# Patient Record
Sex: Male | Born: 1980 | Race: White | Hispanic: No | Marital: Married
Health system: Southern US, Community
[De-identification: ages and names within clinical notes are randomized; demographics above are authoritative.]

---

## 2010-02-20 ENCOUNTER — Emergency Department: Payer: Self-pay | Admitting: Emergency Medicine

## 2010-09-22 ENCOUNTER — Inpatient Hospital Stay: Payer: Self-pay | Admitting: Internal Medicine

## 2011-07-07 ENCOUNTER — Emergency Department: Payer: Self-pay | Admitting: Emergency Medicine

## 2011-07-11 ENCOUNTER — Emergency Department: Payer: Self-pay | Admitting: *Deleted

## 2012-04-01 ENCOUNTER — Emergency Department: Payer: Self-pay | Admitting: Emergency Medicine

## 2012-04-01 LAB — DRUG SCREEN, URINE
Amphetamines, Ur Screen: NEGATIVE (ref ?–1000)
Cannabinoid 50 Ng, Ur ~~LOC~~: NEGATIVE (ref ?–50)
Cocaine Metabolite,Ur ~~LOC~~: NEGATIVE (ref ?–300)
MDMA (Ecstasy)Ur Screen: POSITIVE (ref ?–500)
Opiate, Ur Screen: NEGATIVE (ref ?–300)
Phencyclidine (PCP) Ur S: NEGATIVE (ref ?–25)

## 2012-04-01 LAB — COMPREHENSIVE METABOLIC PANEL
BUN: 7 mg/dL (ref 7–18)
Bilirubin,Total: 0.4 mg/dL (ref 0.2–1.0)
Calcium, Total: 8.9 mg/dL (ref 8.5–10.1)
Chloride: 103 mmol/L (ref 98–107)
EGFR (African American): >60
Glucose: 145 mg/dL — ABNORMAL HIGH (ref 65–99)
Osmolality: 274 (ref 275–301)
Potassium: 4.1 mmol/L (ref 3.5–5.1)
SGOT(AST): 23 U/L (ref 15–37)
SGPT (ALT): 50 U/L
Sodium: 137 mmol/L (ref 136–145)

## 2012-04-01 LAB — URINALYSIS, COMPLETE
Bacteria: NONE SEEN
Blood: NEGATIVE
Glucose,UR: NEGATIVE mg/dL (ref 0–75)
Leukocyte Esterase: NEGATIVE
Nitrite: NEGATIVE
Protein: NEGATIVE
RBC,UR: 1 /HPF (ref 0–5)

## 2012-04-01 LAB — CBC
HCT: 44.8 % (ref 40.0–52.0)
MCH: 34.1 pg — ABNORMAL HIGH (ref 26.0–34.0)
MCHC: 34.7 g/dL (ref 32.0–36.0)
MCV: 99 fL (ref 80–100)
Platelet: 246 10*3/uL (ref 150–440)
RBC: 4.54 10*6/uL (ref 4.40–5.90)
RDW: 12.7 % (ref 11.5–14.5)
WBC: 6.3 10*3/uL (ref 3.8–10.6)

## 2012-06-20 ENCOUNTER — Emergency Department: Payer: Self-pay | Admitting: *Deleted

## 2012-07-02 ENCOUNTER — Emergency Department: Payer: Self-pay | Admitting: Emergency Medicine

## 2012-07-23 ENCOUNTER — Observation Stay: Payer: Self-pay | Admitting: Specialist

## 2012-07-23 LAB — DRUG SCREEN, URINE
Benzodiazepine, Ur Scrn: POSITIVE (ref ?–200)
Cannabinoid 50 Ng, Ur ~~LOC~~: NEGATIVE (ref ?–50)
MDMA (Ecstasy)Ur Screen: POSITIVE (ref ?–500)
Tricyclic, Ur Screen: NEGATIVE (ref ?–1000)

## 2012-07-23 LAB — BASIC METABOLIC PANEL
Anion Gap: 4 — ABNORMAL LOW (ref 7–16)
BUN: 8 mg/dL (ref 7–18)
Calcium, Total: 8.6 mg/dL (ref 8.5–10.1)
Co2: 31 mmol/L (ref 21–32)
EGFR (African American): >60
EGFR (Non-African Amer.): >60
Glucose: 101 mg/dL — ABNORMAL HIGH (ref 65–99)
Osmolality: 280 (ref 275–301)
Potassium: 3.7 mmol/L (ref 3.5–5.1)
Sodium: 141 mmol/L (ref 136–145)

## 2012-07-23 LAB — CBC WITH DIFFERENTIAL/PLATELET
Basophil %: 1.2 %
Eosinophil %: 5.3 %
HCT: 43.4 % (ref 40.0–52.0)
Lymphocyte #: 2.6 10*3/uL (ref 1.0–3.6)
Lymphocyte %: 42.6 %
Monocyte #: 0.4 x10 3/mm (ref 0.2–1.0)
Monocyte %: 6.8 %
Neutrophil %: 44.1 %
Platelet: 242 10*3/uL (ref 150–440)
RBC: 4.47 10*6/uL (ref 4.40–5.90)
RDW: 13.2 % (ref 11.5–14.5)
WBC: 6.1 10*3/uL (ref 3.8–10.6)

## 2012-10-16 ENCOUNTER — Emergency Department: Payer: Self-pay | Admitting: Emergency Medicine

## 2012-10-16 LAB — BASIC METABOLIC PANEL
Anion Gap: 8 (ref 7–16)
BUN: 9 mg/dL (ref 7–18)
Calcium, Total: 8.9 mg/dL (ref 8.5–10.1)
Chloride: 106 mmol/L (ref 98–107)
Potassium: 4.3 mmol/L (ref 3.5–5.1)
Sodium: 139 mmol/L (ref 136–145)

## 2012-10-16 LAB — CBC
MCH: 34.4 pg — ABNORMAL HIGH (ref 26.0–34.0)
MCHC: 35 g/dL (ref 32.0–36.0)
MCV: 98 fL (ref 80–100)
Platelet: 292 10*3/uL (ref 150–440)
RDW: 15.3 % — ABNORMAL HIGH (ref 11.5–14.5)
WBC: 8.2 10*3/uL (ref 3.8–10.6)

## 2012-10-16 LAB — CK TOTAL AND CKMB (NOT AT ARMC)
CK, Total: 139 U/L (ref 35–232)
CK-MB: 0.8 ng/mL (ref 0.5–3.6)

## 2012-10-17 LAB — BASIC METABOLIC PANEL
Calcium, Total: 9 mg/dL (ref 8.5–10.1)
Chloride: 104 mmol/L (ref 98–107)
Co2: 27 mmol/L (ref 21–32)
EGFR (African American): >60
EGFR (Non-African Amer.): >60
Glucose: 103 mg/dL — ABNORMAL HIGH (ref 65–99)
Osmolality: 275 (ref 275–301)
Potassium: 4 mmol/L (ref 3.5–5.1)
Sodium: 137 mmol/L (ref 136–145)

## 2012-10-17 LAB — CBC
HCT: 43.6 % (ref 40.0–52.0)
HGB: 15.4 g/dL (ref 13.0–18.0)
MCH: 34.7 pg — ABNORMAL HIGH (ref 26.0–34.0)
MCHC: 35.2 g/dL (ref 32.0–36.0)
MCV: 99 fL (ref 80–100)
Platelet: 289 10*3/uL (ref 150–440)
RBC: 4.43 10*6/uL (ref 4.40–5.90)
RDW: 15.5 % — ABNORMAL HIGH (ref 11.5–14.5)
WBC: 7.2 10*3/uL (ref 3.8–10.6)

## 2012-10-17 LAB — PROTIME-INR
Prothrombin Time: 14.1 secs (ref 11.5–14.7)
Prothrombin Time: 13.1 secs (ref 11.5–14.7)

## 2012-10-17 LAB — CK TOTAL AND CKMB (NOT AT ARMC): CK-MB: 0.8 ng/mL (ref 0.5–3.6)

## 2012-10-17 LAB — APTT: Activated PTT: 30.9 secs (ref 23.6–35.9)

## 2012-10-18 ENCOUNTER — Observation Stay: Payer: Self-pay | Admitting: Internal Medicine

## 2012-10-18 LAB — CBC WITH DIFFERENTIAL/PLATELET
Basophil %: 0.9 %
HCT: 42.3 % (ref 40.0–52.0)
HGB: 14.5 g/dL (ref 13.0–18.0)
Lymphocyte #: 2.6 10*3/uL (ref 1.0–3.6)
Lymphocyte %: 36.1 %
MCHC: 34.2 g/dL (ref 32.0–36.0)
MCV: 99 fL (ref 80–100)
Monocyte %: 7.5 %
Neutrophil #: 3.8 10*3/uL (ref 1.4–6.5)
Neutrophil %: 53.1 %
RDW: 15.3 % — ABNORMAL HIGH (ref 11.5–14.5)

## 2012-10-18 LAB — COMPREHENSIVE METABOLIC PANEL
Alkaline Phosphatase: 53 U/L (ref 50–136)
Bilirubin,Total: 0.5 mg/dL (ref 0.2–1.0)
Calcium, Total: 8.8 mg/dL (ref 8.5–10.1)
Co2: 28 mmol/L (ref 21–32)
Creatinine: 1 mg/dL (ref 0.60–1.30)
EGFR (Non-African Amer.): >60
Glucose: 105 mg/dL — ABNORMAL HIGH (ref 65–99)
Potassium: 4.4 mmol/L (ref 3.5–5.1)
SGOT(AST): 36 U/L (ref 15–37)
SGPT (ALT): 76 U/L (ref 12–78)

## 2012-10-18 LAB — DRUG SCREEN, URINE
Barbiturates, Ur Screen: NEGATIVE (ref ?–200)
Cannabinoid 50 Ng, Ur ~~LOC~~: NEGATIVE (ref ?–50)
Cocaine Metabolite,Ur ~~LOC~~: NEGATIVE (ref ?–300)
MDMA (Ecstasy)Ur Screen: NEGATIVE (ref ?–500)
Methadone, Ur Screen: NEGATIVE (ref ?–300)
Opiate, Ur Screen: POSITIVE (ref ?–300)
Tricyclic, Ur Screen: NEGATIVE (ref ?–1000)

## 2012-10-27 ENCOUNTER — Emergency Department: Payer: Self-pay | Admitting: Emergency Medicine

## 2012-11-28 ENCOUNTER — Emergency Department: Payer: Self-pay | Admitting: Emergency Medicine

## 2012-11-28 LAB — BASIC METABOLIC PANEL
BUN: 20 mg/dL — ABNORMAL HIGH (ref 7–18)
Calcium, Total: 8.8 mg/dL (ref 8.5–10.1)
Chloride: 105 mmol/L (ref 98–107)
Co2: 29 mmol/L (ref 21–32)
Creatinine: 1.21 mg/dL (ref 0.60–1.30)
EGFR (African American): >60
EGFR (Non-African Amer.): >60
Glucose: 130 mg/dL — ABNORMAL HIGH (ref 65–99)
Osmolality: 286 (ref 275–301)
Potassium: 3 mmol/L — ABNORMAL LOW (ref 3.5–5.1)

## 2012-11-28 LAB — CBC WITH DIFFERENTIAL/PLATELET
Basophil #: 0 10*3/uL (ref 0.0–0.1)
Basophil %: 0.2 %
Eosinophil #: 0.1 10*3/uL (ref 0.0–0.7)
HGB: 15.3 g/dL (ref 13.0–18.0)
MCH: 33 pg (ref 26.0–34.0)
MCHC: 33.5 g/dL (ref 32.0–36.0)
Monocyte %: 5.8 %
Neutrophil %: 77.1 %
RDW: 14.8 % — ABNORMAL HIGH (ref 11.5–14.5)

## 2012-11-28 LAB — APTT: Activated PTT: 26.1 secs (ref 23.6–35.9)

## 2012-11-28 LAB — PROTIME-INR
INR: 0.9
Prothrombin Time: 12.3 secs (ref 11.5–14.7)

## 2013-02-25 ENCOUNTER — Emergency Department: Payer: Self-pay | Admitting: Emergency Medicine

## 2013-03-30 ENCOUNTER — Emergency Department: Payer: Self-pay | Admitting: Emergency Medicine

## 2013-03-30 LAB — CBC
HCT: 43.7 % (ref 40.0–52.0)
HGB: 15.2 g/dL (ref 13.0–18.0)
MCH: 33.8 pg (ref 26.0–34.0)
MCV: 97 fL (ref 80–100)
RBC: 4.5 10*6/uL (ref 4.40–5.90)
RDW: 13.1 % (ref 11.5–14.5)
WBC: 6.6 10*3/uL (ref 3.8–10.6)

## 2013-03-30 LAB — BASIC METABOLIC PANEL
Co2: 29 mmol/L (ref 21–32)
Creatinine: 0.99 mg/dL (ref 0.60–1.30)
EGFR (Non-African Amer.): >60
Glucose: 90 mg/dL (ref 65–99)
Potassium: 3.3 mmol/L — ABNORMAL LOW (ref 3.5–5.1)
Sodium: 140 mmol/L (ref 136–145)

## 2013-03-30 LAB — APTT: Activated PTT: 41.3 secs — ABNORMAL HIGH (ref 23.6–35.9)

## 2013-03-30 LAB — PROTIME-INR
INR: 2.9
Prothrombin Time: 29.4 secs — ABNORMAL HIGH (ref 11.5–14.7)

## 2013-04-11 ENCOUNTER — Emergency Department: Payer: Self-pay | Admitting: Emergency Medicine

## 2013-04-11 LAB — PROTIME-INR
INR: 1.3
Prothrombin Time: 16.3 secs — ABNORMAL HIGH (ref 11.5–14.7)

## 2013-08-27 ENCOUNTER — Emergency Department: Payer: Self-pay

## 2013-08-27 LAB — CBC
MCH: 34.4 pg — ABNORMAL HIGH (ref 26.0–34.0)
MCHC: 34.4 g/dL (ref 32.0–36.0)
MCV: 100 fL (ref 80–100)
Platelet: 300 10*3/uL (ref 150–440)
RDW: 13.2 % (ref 11.5–14.5)
WBC: 9.2 10*3/uL (ref 3.8–10.6)

## 2013-08-27 LAB — BASIC METABOLIC PANEL
Anion Gap: 8 (ref 7–16)
BUN: 11 mg/dL (ref 7–18)
Calcium, Total: 8.8 mg/dL (ref 8.5–10.1)
Co2: 18 mmol/L — ABNORMAL LOW (ref 21–32)
Osmolality: 279 (ref 275–301)
Potassium: 3.8 mmol/L (ref 3.5–5.1)
Sodium: 139 mmol/L (ref 136–145)

## 2013-09-16 ENCOUNTER — Emergency Department: Payer: Self-pay | Admitting: Emergency Medicine

## 2013-09-16 LAB — BASIC METABOLIC PANEL
Anion Gap: 6 — ABNORMAL LOW (ref 7–16)
BUN: 9 mg/dL (ref 7–18)
Chloride: 107 mmol/L (ref 98–107)
Co2: 26 mmol/L (ref 21–32)
EGFR (African American): >60
Glucose: 110 mg/dL — ABNORMAL HIGH (ref 65–99)
Osmolality: 277 (ref 275–301)

## 2013-09-16 LAB — URINALYSIS, COMPLETE
Bacteria: NONE SEEN
Bilirubin,UR: NEGATIVE
Glucose,UR: NEGATIVE mg/dL (ref 0–75)
Ketone: NEGATIVE
Nitrite: NEGATIVE
Ph: 6 (ref 4.5–8.0)
Specific Gravity: 1.021 (ref 1.003–1.030)
Squamous Epithelial: <1

## 2013-09-16 LAB — CBC WITH DIFFERENTIAL/PLATELET
Basophil %: 0.7 %
Eosinophil #: 0.2 10*3/uL (ref 0.0–0.7)
Eosinophil %: 3.9 %
HGB: 15.9 g/dL (ref 13.0–18.0)
Lymphocyte #: 2.5 10*3/uL (ref 1.0–3.6)
Lymphocyte %: 39.1 %
MCH: 34.4 pg — ABNORMAL HIGH (ref 26.0–34.0)
MCHC: 34.8 g/dL (ref 32.0–36.0)
MCV: 99 fL (ref 80–100)
Monocyte #: 0.5 x10 3/mm (ref 0.2–1.0)
Monocyte %: 7.4 %
Neutrophil %: 48.9 %
Platelet: 240 10*3/uL (ref 150–440)
RBC: 4.61 10*6/uL (ref 4.40–5.90)
RDW: 13.3 % (ref 11.5–14.5)
WBC: 6.3 10*3/uL (ref 3.8–10.6)

## 2013-11-15 ENCOUNTER — Emergency Department: Payer: Self-pay | Admitting: Emergency Medicine

## 2013-11-15 LAB — CBC WITH DIFFERENTIAL/PLATELET
Basophil #: 0.1 10*3/uL (ref 0.0–0.1)
Basophil %: 0.9 %
Eosinophil #: 0.3 10*3/uL (ref 0.0–0.7)
Eosinophil %: 4.6 %
HCT: 45.4 % (ref 40.0–52.0)
HGB: 15.8 g/dL (ref 13.0–18.0)
Lymphocyte #: 2.3 10*3/uL (ref 1.0–3.6)
Lymphocyte %: 34.5 %
MCV: 99 fL (ref 80–100)
Monocyte %: 7.9 %
Neutrophil #: 3.4 10*3/uL (ref 1.4–6.5)
Neutrophil %: 52.1 %
RDW: 12.8 % (ref 11.5–14.5)

## 2013-11-15 LAB — BASIC METABOLIC PANEL
Chloride: 106 mmol/L (ref 98–107)
Co2: 27 mmol/L (ref 21–32)
Creatinine: 1.18 mg/dL (ref 0.60–1.30)
EGFR (Non-African Amer.): >60
Glucose: 98 mg/dL (ref 65–99)
Potassium: 3.4 mmol/L — ABNORMAL LOW (ref 3.5–5.1)
Sodium: 137 mmol/L (ref 136–145)

## 2013-11-15 LAB — URINALYSIS, COMPLETE
Bilirubin,UR: NEGATIVE
Blood: NEGATIVE
Glucose,UR: NEGATIVE mg/dL (ref 0–75)
Ketone: NEGATIVE
Leukocyte Esterase: NEGATIVE
Nitrite: NEGATIVE
Ph: 6 (ref 4.5–8.0)
Protein: NEGATIVE
RBC,UR: 1 /HPF (ref 0–5)
WBC UR: 2 /HPF (ref 0–5)

## 2013-11-15 LAB — HEPATIC FUNCTION PANEL A (ARMC)
Alkaline Phosphatase: 61 U/L
Bilirubin, Direct: <0.1 mg/dL (ref 0.00–0.20)
SGPT (ALT): 66 U/L (ref 12–78)
Total Protein: 6.9 g/dL (ref 6.4–8.2)

## 2013-11-28 ENCOUNTER — Ambulatory Visit: Payer: Self-pay | Admitting: Family Medicine

## 2013-12-23 ENCOUNTER — Ambulatory Visit: Payer: Self-pay | Admitting: Family Medicine

## 2014-01-27 ENCOUNTER — Ambulatory Visit: Payer: Self-pay | Admitting: Emergency Medicine

## 2014-02-10 ENCOUNTER — Ambulatory Visit: Payer: Self-pay | Admitting: Family Medicine

## 2014-04-02 IMAGING — CT CT ABD-PELV W/ CM
1 of 3 series · 13 of 32 positions shown, 19 images · non-contrast
Comparison: none

REASON FOR EXAM: (1) trauma, RUQ Right Flank pain; (2) Trauma, RUQ Flank
pain
COMMENTS:

PROCEDURE:     CT  - CT ABDOMEN / PELVIS  W  - June 20, 2012  [DATE]
RESULT:     History: Trauma.
Comparison Study: Prior CT of 07/08/2011.

[Series 3: 3mm abd soft tissue · axial · 1.27mm/px · z∈[-847,-529]mm · 13 of 124 slices shown, 19 images]
[im 9/124  soft-tissue]
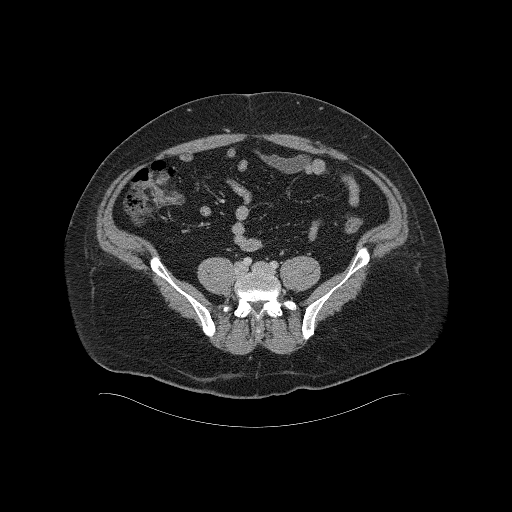
[im 9/124  bone]
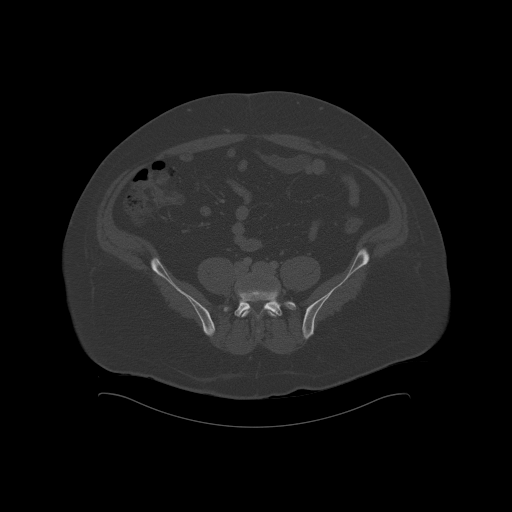
[im 18/124  soft-tissue]
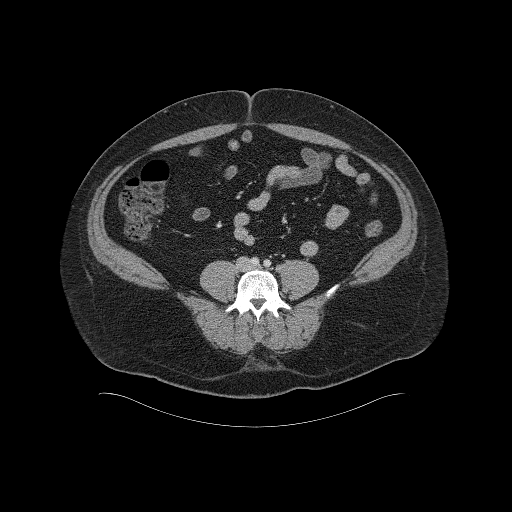
[im 27/124  soft-tissue]
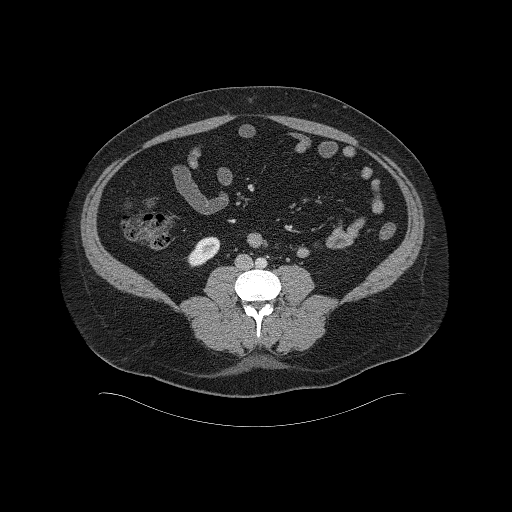
[im 36/124  soft-tissue]
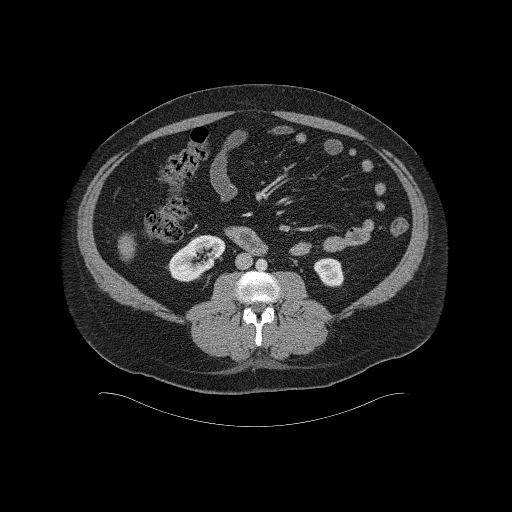
[im 44/124  soft-tissue]
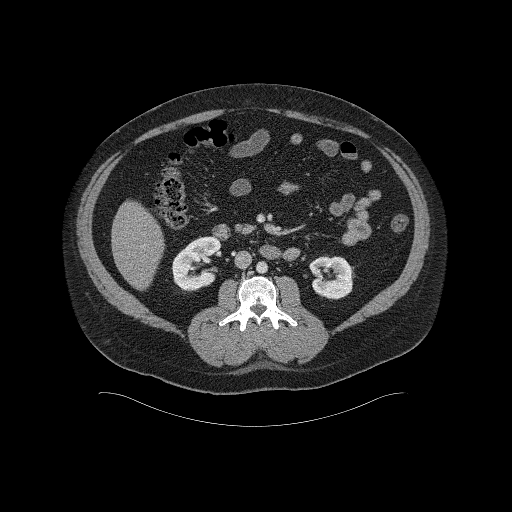
[im 53/124  soft-tissue]
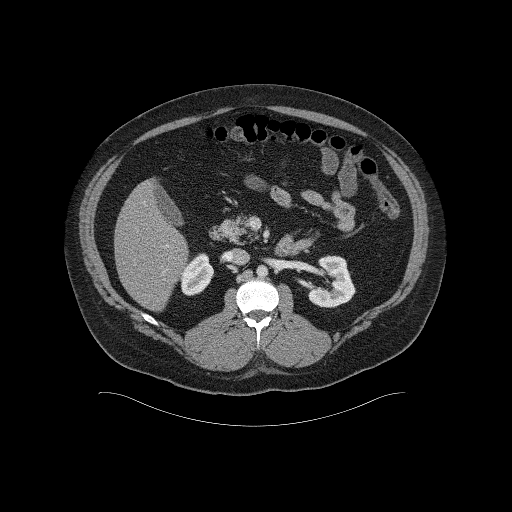
[im 62/124  soft-tissue]
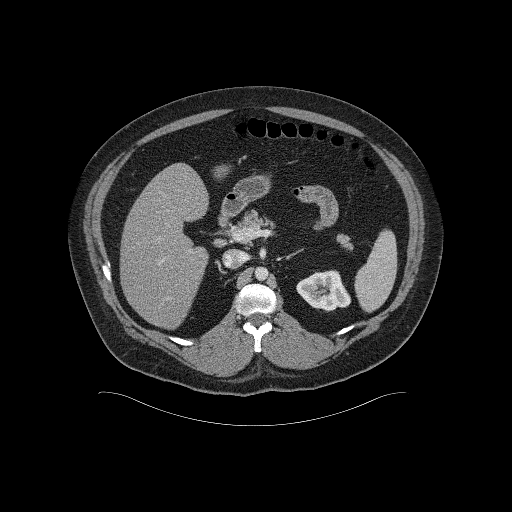
[im 71/124  soft-tissue]
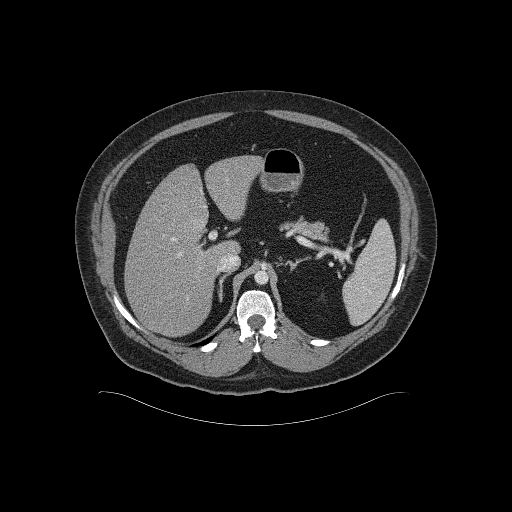
[im 80/124  soft-tissue]
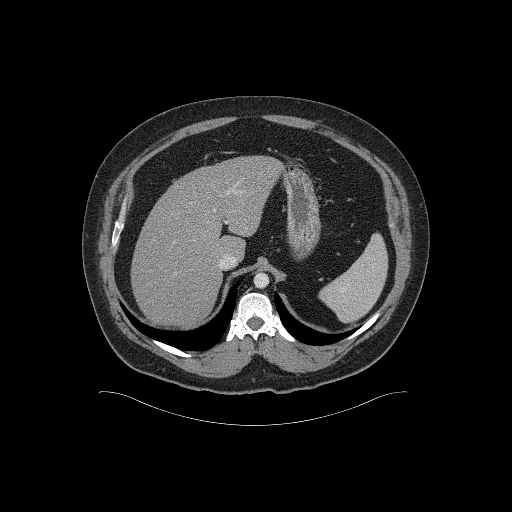
[im 80/124  bone]
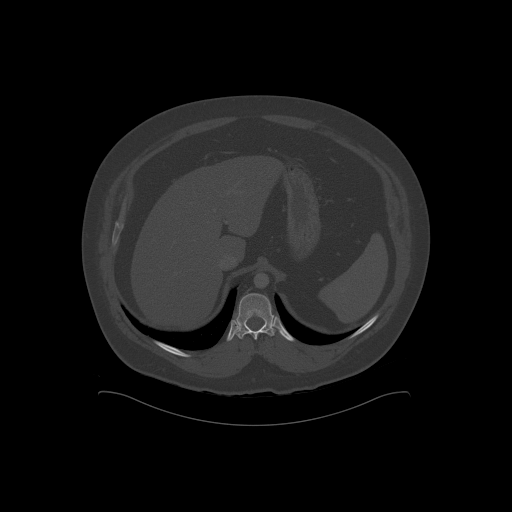
[im 88/124  soft-tissue]
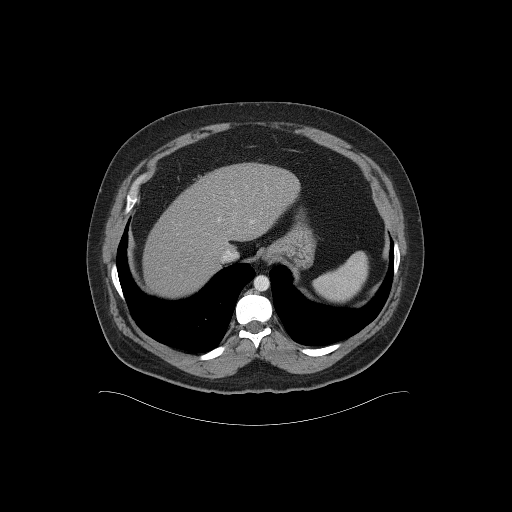
[im 88/124  lung]
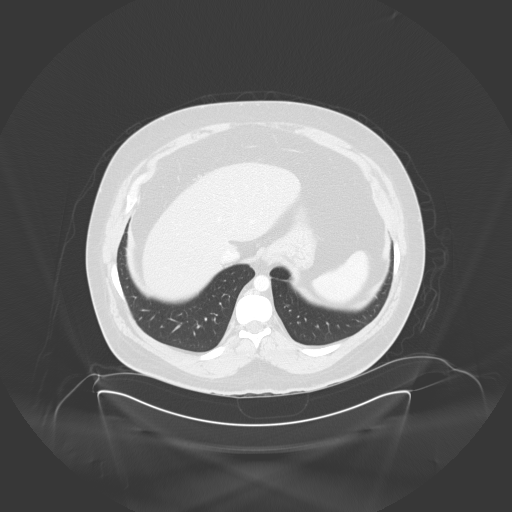
[im 97/124  soft-tissue]
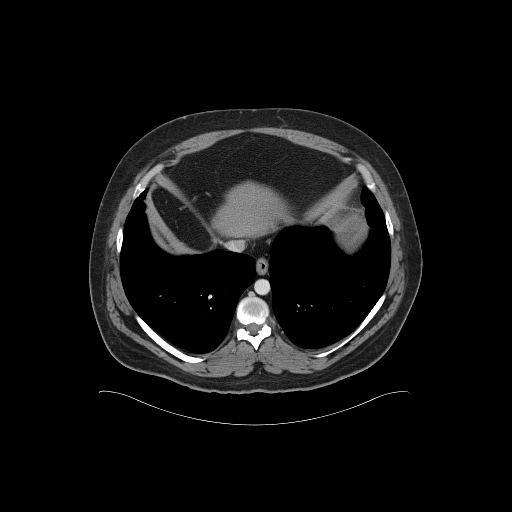
[im 97/124  lung]
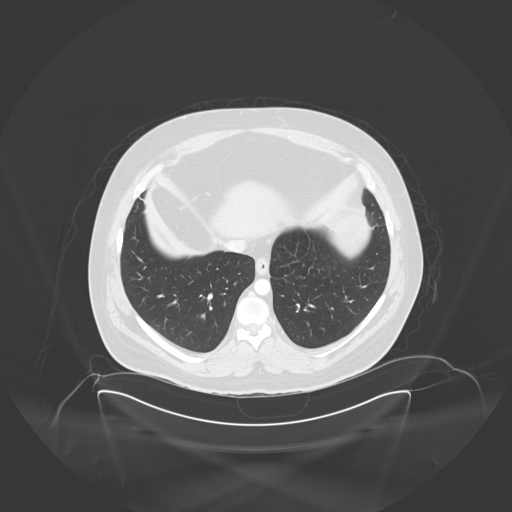
[im 106/124  soft-tissue]
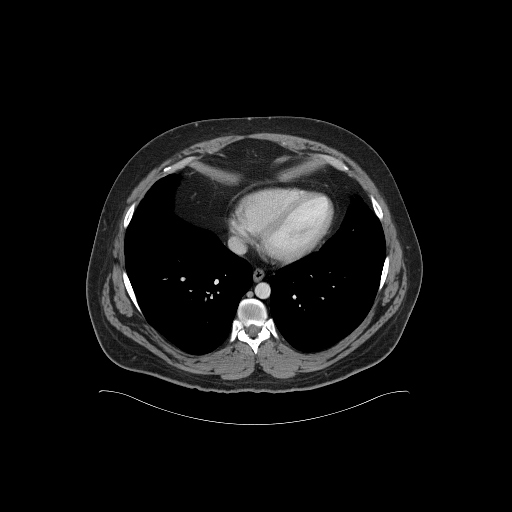
[im 106/124  lung]
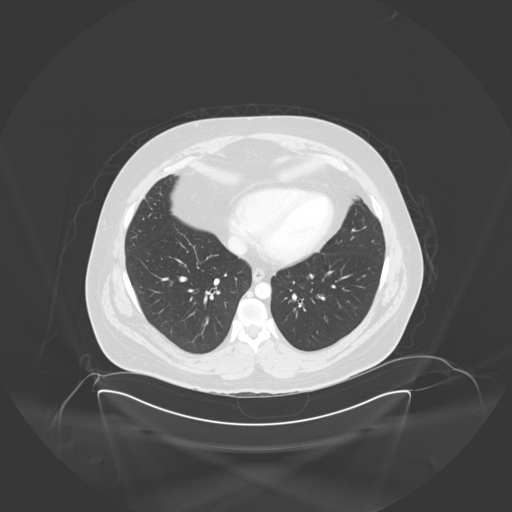
[im 115/124  soft-tissue]
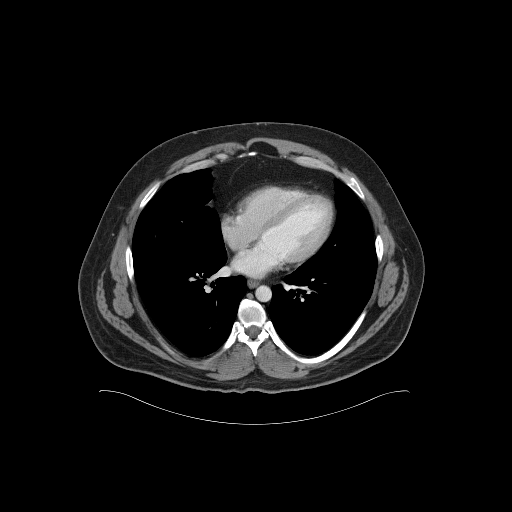
[im 115/124  lung]
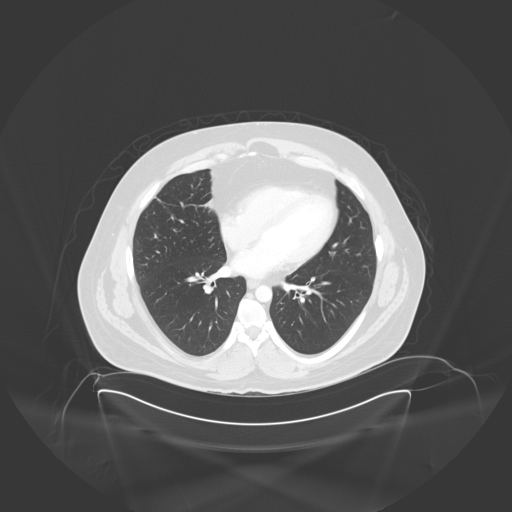

[13 of 32 positions shown; findings below may reference images not displayed]

FINDINGS: Standard CT obtained with 125 cc of Ksovue-GR7. Evaluation in 3
dimensions on separate workstation performed. Liver normal. Gallbladder
normal. Spleen normal. Pancreas normal. Adrenals normal. Kidneys normal.
Aorta nondistended. No bowel distention. Appendix normal. Bladder
nondistended and intact. No bony abnormality. No hydronephrosis. Symmetric
renal perfusion. Mild atelectasis lung bases.
IMPRESSION: No acute abnormality.

## 2015-03-09 NOTE — Discharge Summary (Signed)
PATIENT NAME:  Gabriel Scott, Gabriel Scott MR#:  [id number] DATE OF BIRTH:  02-26-1981  DATE OF ADMISSION:  10/18/2012 DATE OF DISCHARGE:  10/18/2012  ADMISSION DIAGNOSES:  1. Chest pain.  2. History of pulmonary emboli, deep venous thrombosis.   DISCHARGE DIAGNOSES:  1. Chest pain, likely musculoskeletal.  2. History of pulmonary emboli, deep vein thrombosis noncompliant with medications.  3. Obesity.  4. History of chronic obstructive pulmonary disease.   CONSULTS: Dr. Gwen PoundsKowalski from cardiology.   LABORATORY, RADIOLOGICAL AND DIAGNOSTIC DATA: 2-D echocardiogram showed an ejection fraction of 55%, mild MR, mild TR. Troponins x3 were negative. White blood cells 7.1, hemoglobin 14.5, hematocrit 43, platelets 254, sodium 138, potassium 4.4, chloride 105, bicarbonate 28, BUN 13, creatinine 1.00, glucose 105.   HOSPITAL COURSE: This is a 34 year old noncompliant patient with a history of pulmonary emboli, [facility], was on Xarelto, came in with chest pain. For further details, please refer to the history and physical.  1. Chest pain, likely musculoskeletal. The patient has tenderness to palpation at chest wall. Troponin x3 were negative. EKG is normal sinus rhythm. Telemetry showed no acute events. Cardiology consult was appreciated. Dr. Gwen PoundsKowalski did not feel that this was related to a cardiac issue, but more likely related to musculoskeletal. His echocardiogram was nonacute.  2. Pulmonary emboli, deep vein thrombosis. Explained to the patient that he must remain on treatment for at least six months, have close follow-up. We will restart the Xarelto per the protocol.  3. Obesity. Encouraged weight loss.  4. Tobacco abuse. The patient was counseled for four minutes, not interested in quitting at this time, but did discharge the patient with nicotine patch just in case.  5. Chronic obstructive pulmonary disease, which was stable.   DISCHARGE MEDICATIONS:  1. Prozac 40 mg at  bedtime.  2. Trazodone 150 daily.  3. Advair Diskus 500/50 b.i.d.  4. Xarelto 15 mg b.i.d., stop after 21 days, then 20 mg daily.  5. Nicotine patch 21 mg per 24 hours.  6. Percocet 5/325, two times a day as needed for pain, #20.   DISCHARGE OXYGEN: None.   DISCHARGE ACTIVITY: As tolerated.   DISCHARGE DIET: Regular.   DISCHARGE FOLLOWUP: The patient will need to follow up with [facility] in 1 to 2 weeks.   TIME SPENT: Approximately 35 minutes.  ____________________________ Janyth ContesSital P. Juliene PinaMody, MD spm:ap D: 10/18/2012 11:14:26 ET T: 10/18/2012 11:23:42 ET JOB#: [id number]  cc: Daisy Lites P. Juliene PinaMody, MD, <Dictator> Janyth ContesSITAL P Earl Zellmer MD ELECTRONICALLY SIGNED 10/18/2012 12:35

## 2015-03-09 NOTE — H&P (Signed)
PATIENT NAME:  Gabriel Scott, Gabriel Scott MR#:  [id number] DATE OF BIRTH:  [birth year]  DATE OF ADMISSION:  07/23/2012  REFERRING PHYSICIAN: Dr. Gaetano NetKevin Boland  PRIMARY CARE PHYSICIAN: [facility]  CHIEF COMPLAINT: Status post fall.   HISTORY OF PRESENT ILLNESS: This is a [age] year old male with history of chronic obstructive pulmonary disease on home oxygen 3 liters nasal cannula and tobacco abuse, depression anxiety disorder presents status post fall. Patient reports he has been on the roof retrieving a toy where he reports he lost balance and fell on the floor. Reports upon hitting the ground patient lost consciousness for some time and he presented with complaints of severe lower back pain and right hip pain and patient presented with C-collar. Patient had CT head and cervical spine which did not show any evidence of acute fracture or any acute abnormality. As well patient had lumbosacral x-ray and right hip x-ray and pelvis x-ray which did not show any evidence of acute fracture once reviewed by ED physician. Patient was complaining of significant pain despite receiving IV Dilaudid x2 so admission was requested by hospitalist service for IV pain requirement and evaluation by PT/OT services. Patient denies any chest pain, shortness of breath, lightheadedness, dizziness, palpitation, diaphoresis, nausea, vomiting, confusion, altered mental status preceding the fall. Reports it is because he lost his balance but he reports some previous episodes loss of consciousness after he hit the floor.    PAST MEDICAL HISTORY:  1. Asthma.  2. Chronic obstructive pulmonary disease.  3. Emphysema.  4. Tobacco abuse.  5. Depression.  6. Anxiety disorder.   PAST SURGICAL HISTORY: None.   FAMILY HISTORY: Significant for coronary artery disease. Denies any cerebrovascular accident.   ALLERGIES: Penicillin.   SOCIAL HISTORY: Patient is active smoker, smokes up to 1 pack per day. Denies any alcohol or drug abuse.    HOME MEDICATIONS:  1. Prozac 40 mg oral daily.  2. Trazodone 400 mg at bedtime.   REVIEW OF SYSTEMS: CONSTITUTIONAL: Denies any fever, fatigue, weakness. EYES: Denies blurry vision, double vision or pain. ENT: Denies tinnitus, ear pain, hearing loss. RESPIRATORY: Denies cough, hemoptysis. Has history of chronic obstructive pulmonary disease and baseline dyspnea on 3 liters nasal cannula. CARDIOVASCULAR: Denies chest pain, edema, arrhythmia, palpitations. GASTROINTESTINAL: Denies nausea, vomiting, diarrhea, abdominal pain. GENITOURINARY: Denies dysuria, hematuria, renal colic. ENDO: Denies polyuria, nocturia, heat or cold intolerance. HEMATOLOGY: Denies anemia, easy bruising, bleeding diathesis. INTEGUMENTARY: Denies any acne or rash. MUSCULOSKELETAL: Complains of back pain, lower back pain and right hip pain. NEURO: Denies any numbness, dysarthria, epilepsy, tremors, vertigo, ataxia. PSYCH: Has anxiety, depression. Denies any alcohol or substance abuse.   PHYSICAL EXAMINATION:  VITAL SIGNS: Temperature 97.2, pulse 90, respiratory rate 18, blood pressure 131/59, saturating 94% on 3 liters nasal cannula.   GENERAL: Obese male looks comfortable in bed in no apparent distress.   HEENT: Head atraumatic, normocephalic. Pupils equal, reactive to light. Pink conjunctivae. Anicteric sclerae. Moist oral mucosa.   NECK: Supple. No thyromegaly. No JVD.   CHEST: Good air entry bilaterally with scattered wheezing. No rales, rhonchi.   CARDIOVASCULAR: S1, S2 heard. No rubs, murmur, gallops.   ABDOMEN: Obese, soft, nontender, nondistended. Bowel sounds present.   EXTREMITIES: No edema, no clubbing, no cyanosis.   MUSCULOSKELETAL: Patient complains of pain on the right hip area and lower back and thoracic spine area.   NEUROLOGIC: Cranial nerves grossly intact. Motor 5/5 in all extremities. Sensation symmetrical and intact.   PSYCHIATRIC: Appropriate affect. Awake, alert x3.  Intact judgment and  insight.   LABORATORY, DIAGNOSTIC AND RADIOLOGICAL DATA: Patient does not have any labs done  yet. 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   ASSESSMENT AND PLAN:  1. Status post fall/gait disturbance. This is due to loss of balance. It was not proceeded by confusion, altered mental status or syncope. Patient had brief episode of loss of consciousness after fall. Currently he is back to his baseline. CT brain and cervical spine does not show any fractures or abnormality. As well his lumbosacral x-ray, pelvic x-ray and right hip x-ray. Will obtain thoracic x-ray to rule out any acute fractures. Meanwhile will start patient on p.r.n. morphine. Will consult PT and OT service.  2. Chronic obstructive pulmonary disease. Patient has some mild wheezing but does not complain of any worsening shortness of breath worse than his baseline. As well does not complain of cough, appears to be stable. Will continue patient on albuterol treatment and his 3 liters oxygen which is his baseline at home.  3. Tobacco abuse. Patient was counseled and will be started on Nicoderm patch.  4. DVT prophylaxis. Sequential compression device. 5. CODE STATUS: FULL CODE.   TOTAL TIME SPENT ON ADMISSION AND PATIENT CARE: 40 minutes.  ____________________________ Starleen Arms, MD dse:cms D: 07/23/2012 05:33:45 ET T: 07/23/2012 07:47:14 ET JOB#: [id number]  cc: Starleen Arms, MD, <Dictator> [facility], Dr. Maia Breslow MD ELECTRONICALLY SIGNED 08/11/2012 0:14

## 2015-03-09 NOTE — H&P (Signed)
PATIENT NAME:  Gabriel Scott, Gabriel Scott MR#:  [id number] DATE OF BIRTH:  July 10, 1981  DATE OF ADMISSION:  10/18/2012  REFERRING PHYSICIAN: Dr. Bayard Males.  PRIMARY CARE PHYSICIAN:  The patient is following a PCP at [facility], but reports he has not seen him for a year.   CHIEF COMPLAINT: Chest pain.   HISTORY OF PRESENT ILLNESS: This is a 34 year old male with recent diagnosis of pulmonary embolism and deep vein thrombosis at [facility] with history of medication noncompliance, initial diagnosis was in October of this year, where he was readmitted at [facility] in early November of this year with the same diagnosis where he was not compliant initially with his warfarin, so he was started on Xarelto upon discharge. The patient reports he took his Xarelto for a total of three weeks. When he ran out of the prescription, he did not renew his medication. He reports it was mainly because he was busy and did not have time to go refill his prescription. The patient presented initially today with complaints of chest pain, where he signed AGAINST MEDICAL ADVICE and refused admission. The patient presents again today with similar chest pain in the left retrosternal area radiating to the neck. The patient had negative troponins today. As well, he had negative troponin during his initial visit to the Emergency Department. As well, he had no EKG changes. His vital signs were stable. He was not hypoxic or tachycardic. The patient had different hospitalizations in the past for different complaints. 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 As mentioned earlier, the patient was recently diagnosed with PE and deep vein thrombosis at [facility]. Upon his earlier presentation where he signed AGAINST MEDICAL ADVICE, the patient initially was started on heparin drip prior to signing AGAINST MEDICAL ADVICE and he was  given one dose of Xarelto prior to discharge. As well, he was given prescription of Xarelto which he did not refill. The patient was given one dose of Xarelto in the ED for further treatment of his pulmonary embolism and deep vein thrombosis and admission was requested for further evaluation of the patient's chest pain.  PAST MEDICAL HISTORY:  1. Chronic obstructive pulmonary disease.  2. Tobacco abuse.  3. Depression.  4. Anxiety disorder.  5. Asthma.  6. Recent diagnosis of deep venous thrombosis and PE in October of this year.   PAST SURGICAL HISTORY: None.   FAMILY HISTORY: Significant for coronary artery disease.   ALLERGIES: Penicillin.   SOCIAL HISTORY: The patient is an active smoker, smokes up to 1 pack per day. 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   HOME MEDICATIONS:  1. The patient reports he is not taking his Xarelto for the last week as he ran out of his medication.  2. He is on Prozac 40 mg oral daily.  3. Trazodone 400 mg at bedtime.  4. Advair 1 puff b.i.d.   REVIEW OF SYSTEMS: CONSTITUTIONAL: Denies any fever, fatigue, or weakness. EYES: Denies blurry vision, double vision or pain. ENT: Denies tinnitus, ear pain, hearing loss. RESPIRATORY: Denies any cough, wheezing, hemoptysis, dyspnea. CARDIOVASCULAR: Complains of chest pain. Denies any edema, arrhythmia, palpitations, syncope. GASTROINTESTINAL: Denies nausea, vomiting, diarrhea, or abdominal pain. GENITOURINARY: Denies dysuria, hematuria, or renal colic. ENDOCRINE: Denies polyuria, polydipsia, heat or cold intolerance. HEMATOLOGY: Denies anemia or easy bruising. Has history of  deep venous thrombosis and pulmonary embolus. INTEGUMENTARY: Denies acne, rash, or skin lesions. MUSCULOSKELETAL: Denies any swelling, gout, arthritis, or cramps. NEUROLOGICAL: Denies numbness, dysarthria, epilepsy, tremors, vertigo, or ataxia. PSYCHIATRIC: Has history of anxiety and depression.  Denies any insomnia.   PHYSICAL EXAMINATION:  VITAL SIGNS: Temperature 97.8, pulse 56, respiratory rate 18, blood pressure 135/70, saturating 97% on room air.   GENERAL: Obese male who looks comfortable in bed in no apparent distress.   HEENT: Head normocephalic. Pupils equal, reactive to light. Pink conjunctivae. Anicteric sclerae. Moist oral mucosa.   NECK: Supple. No thyromegaly. No JVD.   CHEST: Good air entry bilaterally. No wheezing, rales, or rhonchi. Has reproducible chest pain upon palpation. Reports is the same kind of pain he has.   CARDIOVASCULAR: S1, S2 heard. No rubs, murmur, or gallops.   ABDOMEN: Obese, soft, nontender, nondistended. Bowel sounds present.   EXTREMITIES: No edema. No clubbing. No cyanosis.   PSYCHIATRIC: Appropriate affect. Awake, alert x3. Intact judgment and insight.   NEUROLOGIC: Cranial nerves grossly intact. Motor five out of five in all extremities.   SKIN: Normal skin turgor. Warm and dry. No rash.   PERTINENT LABS: Glucose 103, BUN 14, creatinine 1.04, sodium 137, potassium 4, chloride 104, CO2 27, troponin less than 0.02. White blood cells 7.2, hemoglobin 15.4, hematocrit 43.6, platelets 289. INR 1. EKG showing normal sinus rhythm with ventricular rate of 72, no significant ST or T wave changes.   ASSESSMENT AND PLAN: This is a 34 year old male who presents with complaints of nontypical chest pain, with known history of recent diagnosis of deep venous thrombosis and pulmonary embolus, noncompliance with medication.  1. Chest pain, appears to be nontypical, musculoskeletal quality as it is reproducible by palpation, has already a negative troponin x2 within the last 24 hours and no EKG changes, but secondary to his recurrent presentation with the same complaints, will have cardiology consult in a.m. and will check 2-D echo, especially with his recent diagnosis of deep venous thrombosis and pulmonary embolus, to evaluate for right ventricular strain.  There is concern about drug-seeking behavior, as the patient appears to be comfortable and sleeping and upon entering the room, he complains of severe chest pain requesting IV pain medications only.  2. History of deep venous thrombosis and PE. The patient has history of noncompliance with his Xarelto. At this point the patient does not appear to be in any respiratory distress, no hypoxia and no tachycardia. The patient will be resumed on his Xarelto. Discussed at length with the patient the importance of compliance.  3. Chronic obstructive pulmonary disease, does not appear to be in exacerbation. Will continue the patient on Advair and nebulizers as needed.  4. Depression. Will continue with Prozac.  5. Deep vein thrombosis prophylaxis. The patient is on Xarelto for treatment.  6. CODE STATUS: FULL CODE.   TOTAL TIME SPENT ON ADMISSION AND PATIENT CARE: 50 minutes.   ____________________________ Starleen Armsawood S. Colie Fugitt, MD dse:ap D: 10/18/2012 03:31:57 ET T: 10/18/2012 07:43:54 ET JOB#: [id number]  cc: Starleen Armsawood S. Morrigan Wickens, MD, <Dictator> Nahla Lukin Teena IraniS Calab Sachse MD ELECTRONICALLY SIGNED 10/19/2012 4:26

## 2015-03-09 NOTE — Discharge Summary (Signed)
PATIENT NAME:  Gabriel Scott, Gabriel Scott MR#:  [id number] DATE OF BIRTH:  1981/07/15  DATE OF ADMISSION:  07/23/2012 DATE OF DISCHARGE:  07/24/2012  HISTORY AND PHYSICAL: For a detailed note, please take a look at the History and Physical  done on admission by Dr. Huey Bienenstockawood Elgergawy.   DISCHARGE DIAGNOSES:  1. Status post fall and ambulation dysfunction.  2. Chronic obstructive pulmonary disease exacerbation.  3. Ongoing tobacco abuse.  4. Depression.   DIET: The patient is being discharged on a regular diet.   ACTIVITY: As tolerated.   FOLLOWUP: [facility].   DISCHARGE MEDICATIONS:  1. Albuterol nebulizer every 6 hours as needed.  2. Prozac 40 mg daily.  3. Trazodone 300 mg daily.  4. Oxygen 3 liters nasal cannula at bedtime.  5. Tylenol with hydrocodone 5/500, 1 tab every 6 hours as needed for pain.  6. Prednisone taper starting at 50 mg down to 10 mg over the next 5 days.   PERTINENT HOSPITAL LABORATORY, DIAGNOSTIC AND RADIOLOGICAL DATA: CT scan of the head done without contrast showed no acute intracranial process. CT of the cervical spine without contrast showed no acute osseous injury of the cervical spine. A lumbar spine x-ray showed no acute osseous abnormalities. A right hip x-ray showed no acute bony abnormality. X-ray of the pelvis showed no acute abnormalities. Thoracic spine AP and lateral showed no evidence of any acute osseous abnormalities. Chest x-ray showed no evidence of acute cardiopulmonary disease.   BRIEF HOSPITAL COURSE: The patient is a 34 year old male who presented to the hospital after suffering a fall off a roof and having difficulty ambulating.   1. Status post fall and ambulation dysfunction: The patient apparently had a fall from a roof when he was attempting to get a toy for a child at a party. He presented to the ER and underwent multiple imaging studies which showed no evidence of any acute bony abnormalities, but he  had significant pain and could not ambulate. Therefore, he was observed overnight in the hospital and was seen by Physical Therapy. They thought he would benefit from Home Health physical therapy which is being arranged for him. He is being discharged on some pain control with some p.r.n. Norco, as stated.  2. Chronic obstructive pulmonary disease exacerbation: The patient does have a strong history of tobacco abuse. He was in mild chronic obstructive pulmonary disease exacerbation while in the hospital, although his chest x-ray was negative. I did put him on some low-dose IV steroids along with around-the-clock nebulizer treatments and his symptoms did improve. He currently is being discharged on a prednisone taper.  3. Tobacco abuse: The patient was maintained on a nicotine patch while in the hospital and strongly advised to quit smoking.  4. Depression: The patient was maintained on his Prozac and trazodone, and he will resume that.   CODE STATUS: The patient is a FULL CODE.   TIME SPENT: 40 minutes.  ____________________________ Rolly PancakeVivek J. Cherlynn KaiserSainani, MD vjs:cbb D: 07/24/2012 15:56:30 ET T: 07/25/2012 12:02:30 ET JOB#: [id number]  cc: Rolly PancakeVivek J. Cherlynn KaiserSainani, MD, <Dictator> [facility] SirenVIVEK J SAINANI MD ELECTRONICALLY SIGNED 07/29/2012 13:23
